# Patient Record
Sex: Female | Born: 1950 | Race: Black or African American | Hispanic: No | Marital: Single | Smoking: Never smoker
Health system: Southern US, Community
[De-identification: ages and names within clinical notes are randomized; demographics above are authoritative.]

## PROBLEM LIST (undated history)

## (undated) DIAGNOSIS — C50919 Malignant neoplasm of unspecified site of unspecified female breast: Secondary | ICD-10-CM

## (undated) DIAGNOSIS — Z9221 Personal history of antineoplastic chemotherapy: Secondary | ICD-10-CM

## (undated) DIAGNOSIS — Z923 Personal history of irradiation: Secondary | ICD-10-CM

## (undated) DIAGNOSIS — C801 Malignant (primary) neoplasm, unspecified: Secondary | ICD-10-CM

## (undated) DIAGNOSIS — E119 Type 2 diabetes mellitus without complications: Secondary | ICD-10-CM

## (undated) DIAGNOSIS — I10 Essential (primary) hypertension: Secondary | ICD-10-CM

---

## 2018-03-21 ENCOUNTER — Ambulatory Visit (INDEPENDENT_AMBULATORY_CARE_PROVIDER_SITE_OTHER): Payer: Medicare (Managed Care)

## 2018-03-21 ENCOUNTER — Ambulatory Visit (HOSPITAL_COMMUNITY)
Admission: EM | Admit: 2018-03-21 | Discharge: 2018-03-21 | Disposition: A | Discharge status: Home / Self Care | Payer: Medicare (Managed Care) | Attending: Family Medicine | Admitting: Family Medicine

## 2018-03-21 ENCOUNTER — Encounter (HOSPITAL_COMMUNITY): Payer: Self-pay | Admitting: Family Medicine

## 2018-03-21 DIAGNOSIS — M25562 Pain in left knee: Secondary | ICD-10-CM

## 2018-03-21 DIAGNOSIS — S8002XA Contusion of left knee, initial encounter: Secondary | ICD-10-CM | POA: Insufficient documentation

## 2018-03-21 HISTORY — DX: Essential (primary) hypertension: I10

## 2018-03-21 HISTORY — DX: Type 2 diabetes mellitus without complications: E11.9

## 2018-03-21 HISTORY — DX: Malignant (primary) neoplasm, unspecified: C80.1

## 2018-03-21 NOTE — Discharge Instructions (Addendum)
You may use over the counter ibuprofen or acetaminophen as needed.   Please follow up with your doctor of you are not seeing gradual improvement within one week.

## 2018-03-21 NOTE — ED Provider Notes (Signed)
[place]   [id number] 03/21/18 Arrival Time: 8338  ASSESSMENT & PLAN:  1. Acute pain of left knee   2. Contusion of left knee, initial encounter    I have personally viewed the imaging studies ordered this visit. No fractures or dislocation appreciated.  Prefers OTC analgesics as needed. WBAT. Ice to knee today.   Discharge Instructions     You may use over the counter ibuprofen or acetaminophen as needed.   Please follow up with your doctor of you are not seeing gradual improvement within one week.     Reviewed expectations re: course of current medical issues. Questions answered. Outlined signs and symptoms indicating need for more acute intervention. Patient verbalized understanding. After Visit Summary given.  SUBJECTIVE: History from: patient. Debra Bowman is a 67 y.o. female who reports fairly persistent mild to moderate pain of her left medial knee; described as aching without radiation. Onset: abrupt, this morning. Injury/trama: yes, reports walking down stairs; slipped "an my left knee bent back behind me as I fell"; immediate discomfort noted; able to bear weight immediately and since. Symptoms have progressed to a point and plateaued since beginning. Aggravating factors: movements of left knee. Alleviating factors: sitting and keeping knee still. Associated symptoms: none reported. Extremity sensation changes or weakness: none. Self treatment: has not tried OTCs for relief of pain. History of similar: no.  History reviewed. No pertinent surgical history.   ROS: As per HPI.   OBJECTIVE:  Vitals:   03/21/18 1007  BP: (!) 155/75  Pulse: 73  Resp: 16  Temp: 99.2 F (37.3 C)  TempSrc: Oral  SpO2: 100%    General appearance: alert; no distress Back: no midline tenderness over lower back; FROM at waist Extremities: . LLE: warm and well perfused; poorly localized mild to moderate tenderness over left medial knee; without gross  deformities; with no swelling; with no bruising; ROM: normal but with reported discomfort CV: brisk extremity capillary refill of LLE; 2+ SD and PT pulse of LLE. Skin: warm and dry; no visible rashes Neurologic: gait normal but favors LLE; normal reflexes of RLE and LLE; normal sensation of RLE and LLE; normal strength of RLE and LLE Psychological: alert and cooperative; normal mood and affect  Imaging: Dg Knee Complete 4 Views Left  Result Date: 03/21/2018 CLINICAL DATA:  67 year old female status post fall down stairs this morning with knee injury. Hyperflexion. Pain medial to the patella. EXAM: LEFT KNEE - COMPLETE 4+ VIEW COMPARISON:  None. FINDINGS: No evidence of fracture, dislocation, or joint effusion. Mild medial compartment joint space loss and degenerative spurring. The patella appears intact. No acute osseous abnormality identified. No discrete soft tissue injury. IMPRESSION: No acute osseous abnormality identified about the left knee. Electronically Signed   By: Genevie Ann M.D.   On: 03/21/2018 10:39   Allergies  Allergen Reactions  . Penicillins Hives    Past Medical History:  Diagnosis Date  . Cancer ([place])    breast  . Diabetes mellitus without complication ([place])   . Hypertension    Social History   Socioeconomic History  . Marital status: Single    Spouse name: Not on file  . Number of children: Not on file  . Years of education: Not on file  . Highest education level: Not on file  Occupational History  . Not on file  Social Needs  . Financial resource strain: Not on file  . Food insecurity:    Worry: Not on file  Inability: Not on file  . Transportation needs:    Medical: Not on file    Non-medical: Not on file  Tobacco Use  . Smoking status: Never Smoker  . Smokeless tobacco: Never Used  Substance and Sexual Activity  . Alcohol use: Never    Frequency: Never  . Drug use: Never  . Sexual activity: Not on file  Lifestyle  . Physical activity:    Days  per week: Not on file    Minutes per session: Not on file  . Stress: Not on file  Relationships  . Social connections:    Talks on phone: Not on file    Gets together: Not on file    Attends religious service: Not on file    Active member of club or organization: Not on file    Attends meetings of clubs or organizations: Not on file    Relationship status: Not on file  Other Topics Concern  . Not on file  Social History Narrative  . Not on file   History reviewed. No pertinent family history. History reviewed. No pertinent surgical history.    Vanessa Kick, MD 03/21/18 1125

## 2018-03-21 NOTE — ED Triage Notes (Signed)
Pt presents to [facility] for assessment of left knee pain after slipping down steps into a split, landing on the left knee this morning.

## 2018-12-31 ENCOUNTER — Encounter: Payer: Self-pay | Admitting: *Deleted

## 2018-12-31 NOTE — Progress Notes (Signed)
Received new pt referral fax from Dr. Jilda Panda.  Information faxed to Isaiah Blakes with new pt referrals [phone]).

## 2019-01-21 ENCOUNTER — Other Ambulatory Visit (HOSPITAL_COMMUNITY): Payer: Self-pay | Admitting: Optometry

## 2019-01-21 ENCOUNTER — Ambulatory Visit (HOSPITAL_COMMUNITY)
Admission: RE | Admit: 2019-01-21 | Discharge: 2019-01-21 | Disposition: A | Discharge status: Home / Self Care | Payer: Medicare Other | Source: Ambulatory Visit | Attending: Cardiovascular Disease | Admitting: Cardiovascular Disease

## 2019-01-21 ENCOUNTER — Other Ambulatory Visit: Payer: Self-pay

## 2019-01-21 DIAGNOSIS — G453 Amaurosis fugax: Secondary | ICD-10-CM | POA: Insufficient documentation

## 2019-01-30 ENCOUNTER — Telehealth: Payer: Self-pay | Admitting: Hematology and Oncology

## 2019-01-30 NOTE — Telephone Encounter (Signed)
Ms. Farahani has been cld and scheduled to see Dr. Lindi Adie on 11/23 at 3:45pm. Pt aware to arrive 15 minutes early.

## 2019-02-16 NOTE — Progress Notes (Signed)
[facility] CONSULT NOTE  Patient Care Team: System, Provider Not In as PCP - General  CHIEF COMPLAINTS/PURPOSE OF CONSULTATION:  History of bilateral breast cancers  HISTORY OF PRESENTING ILLNESS:  Debra Bowman 68 y.o. female is here because of a history of bilateral breast cancers for which she underwent treatment in [place]. The cancer was detected on a screening mammogram on 03/15/16. Diagnostic mammogram and [place] on 04/25/16 showed a 1.5cm left breast mass and calcifications in the inner right breast. 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 She underwent bilateral lumpectomies on 06/01/16 for which pathology showed in the right breast: invasive ductal carcinoma with DCIS, grade 2, 5 lymph nodes negative for carcinoma, and in the left breast: invasive mammary carcinoma, 1.5cm, 2 lymph nodes negative for carcinoma. Onctoype testing showed a score of 19 with a 12% chance of distant recurrence in 10 years with tamoxifen alone. She underwent adjuvant chemotherapy with 6 cycles of TCH and maintenance Herceptin. She underwent adjuvant radiation therapy and was started on adjuvant letrozole therapy with planned duration of 10 years. Bone density scan on 03/15/16 showed normal findings with a T-score of -0.6. Her most recent mammogram on 08/29/18 showed no evidence of malignancy bilaterally. She presents to the clinic today for initial evaluation.   I reviewed her records extensively and collaborated the history with the patient.  SUMMARY OF ONCOLOGIC HISTORY: Oncology History  Bilateral breast cancer ([place])  04/25/2017 Initial Diagnosis   Screen detected 1.5cm left breast mass and calcifications in the inner right breast. 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   06/01/2017 Surgery   right breast: invasive ductal carcinoma with DCIS, grade 2, 5 lymph nodes negative for carcinoma, and in the left breast: invasive mammary carcinoma, 1.5cm, 2 lymph nodes negative for carcinoma. Onctoype testing showed a score of 19 with a 12% chance of distant recurrence     Chemotherapy   TCH foll by Herceptin maintenance     Radiation Therapy   Bilateral Adj XRT   01/2018 -  Anti-estrogen oral therapy   Letrozole 2.5 mg daily     MEDICAL HISTORY:  Past Medical History:  Diagnosis Date  . Cancer ([place])    breast  . Diabetes mellitus without complication ([place])   . Hypertension     SURGICAL HISTORY: No past surgical history on file.  SOCIAL HISTORY: Social History   Socioeconomic History  . Marital status: Single    Spouse name: Not on file  . Number of children: Not on file  . Years of education: Not on file  . Highest education level: Not on file  Occupational History  . Not on file  Social Needs  . Financial resource strain: Not on file  . Food insecurity    Worry: Not on file    Inability: Not on file  . Transportation needs    Medical: Not on file    Non-medical: Not on file  Tobacco Use  . Smoking status: Never Smoker  . Smokeless tobacco: Never Used  Substance and Sexual Activity  . Alcohol use: Never    Frequency: Never  . Drug use: Never  . Sexual activity: Not on file  Lifestyle  .  Physical activity    Days per week: Not on file    Minutes per session: Not on file  . Stress: Not on file  Relationships  . Social Herbalist on phone: Not on file    Gets together: Not on file    Attends religious service: Not on file    Active member of club or organization: Not on file    Attends meetings of clubs or organizations: Not on file    Relationship status: Not on file  . Intimate partner violence    Fear of current or ex partner: Not on  file    Emotionally abused: Not on file    Physically abused: Not on file    Forced sexual activity: Not on file  Other Topics Concern  . Not on file  Social History Narrative  . Not on file    FAMILY HISTORY: No family history on file.  ALLERGIES:  is allergic to penicillins.  MEDICATIONS:  Current Outpatient Medications  Medication Sig Dispense Refill  . amLODipine (NORVASC) 10 MG tablet Take 10 mg by mouth daily.    Marland Kitchen aspirin EC 81 MG tablet Take 1 tablet (81 mg total) by mouth daily.    . Cholecalciferol (VITAMIN D3) 50 MCG (2000 UT) capsule Take 1 capsule (2,000 Units total) by mouth daily.    Marland Kitchen glipiZIDE (GLUCOTROL) 10 MG tablet Take 1 tablet (10 mg total) by mouth 2 (two) times daily before a meal.    . hydrochlorothiazide (HYDRODIURIL) 25 MG tablet Take 1 tablet (25 mg total) by mouth daily.    Marland Kitchen letrozole (FEMARA) 2.5 MG tablet Take 2.5 mg by mouth daily.    Marland Kitchen losartan (COZAAR) 100 MG tablet Take 1 tablet (100 mg total) by mouth daily.    Marland Kitchen losartan-hydrochlorothiazide (HYZAAR) 100-25 MG tablet Take 1 tablet by mouth daily.    . metoprolol succinate (TOPROL-XL) 100 MG 24 hr tablet Take 100 mg by mouth daily. Take with or immediately following a meal.    . Multiple Vitamins-Minerals (CENTRUM SILVER 50+WOMEN) TABS Take 1 tablet by mouth daily.    . vitamin B-12 (CYANOCOBALAMIN) 1000 MCG tablet Take 1 tablet (1,000 mcg total) by mouth daily.     No current facility-administered medications for this visit.     REVIEW OF SYSTEMS:   Constitutional: Denies fevers, chills or abnormal night sweats Eyes: Denies blurriness of vision, double vision or watery eyes Ears, nose, mouth, throat, and face: Denies mucositis or sore throat Respiratory: Denies cough, dyspnea or wheezes Cardiovascular: Denies palpitation, chest discomfort or lower extremity swelling Gastrointestinal:  Denies nausea, heartburn or change in bowel habits Skin: Denies abnormal skin rashes Lymphatics: Denies  new lymphadenopathy or easy bruising Neurological:Denies numbness, tingling or new weaknesses Behavioral/Psych: Mood is stable, no new changes  Breast: Denies any palpable lumps or discharge All other systems were reviewed with the patient and are negative.  PHYSICAL EXAMINATION: ECOG PERFORMANCE STATUS: 1 - Symptomatic but completely ambulatory  Vitals:   02/17/19 1540  BP: (!) 151/77  Pulse: 77  Resp: 18  Temp: 98.2 F (36.8 C)  SpO2: 98%   Filed Weights   02/17/19 1540  Weight: 252 lb 8 oz (114.5 kg)    GENERAL:alert, no distress and comfortable SKIN: skin color, texture, turgor are normal, no rashes or significant lesions EYES: normal, conjunctiva are pink and non-injected, sclera clear OROPHARYNX:no exudate, no erythema and lips, buccal mucosa, and tongue normal  NECK: supple, thyroid normal size, non-tender,  without nodularity LYMPH:  no palpable lymphadenopathy in the cervical, axillary or inguinal LUNGS: clear to auscultation and percussion with normal breathing effort HEART: regular rate & rhythm and no murmurs and no lower extremity edema ABDOMEN:abdomen soft, non-tender and normal bowel sounds Musculoskeletal:no cyanosis of digits and no clubbing  PSYCH: alert & oriented x 3 with fluent speech NEURO: no focal motor/sensory deficits BREAST: No palpable nodules in breast. No palpable axillary or supraclavicular lymphadenopathy (exam performed in the presence of a chaperone)   LABORATORY DATA:  I have reviewed the data as listed No results found for: WBC, HGB, HCT, MCV, PLT No results found for: NA, K, CL, CO2  RADIOGRAPHIC STUDIES: I have personally reviewed the radiological reports and agreed with the findings in the report.  ASSESSMENT AND PLAN:  Bilateral breast cancer ([place]) 06/01/2017:  Right breast: invasive ductal carcinoma with DCIS, grade 2, 5 lymph nodes negative for carcinoma,ER+ 91%, PR+ 71%, HER-2 negative   Left breast: invasive mammary carcinoma,  1.5cm, 2 lymph nodes negative for carcinoma ER+ 14%, PR+ 30%, HER-2 positive by FISH. Onctoype testing showed a score of 19 with a 12% chance of distant recurrence  (Care given in [facility])  S/P adj chemo with TCH-Herceptin maintenance Adj XRT  Current Treatment: Letrozole 2.5 mg Daily start 01/2018 Letrozole Toxicities: None  Surveillance: 1. Breast Exam: 02/17/2019: Benign 2. Mammograms: June 2020: Benign Her previous mammogram and ultrasound recommended ultrasound and mammogram in 1 year. Will order.  Patients wants to get refills 30 days at a time because of insurance reasons  RTC in 1 year     All questions were answered. The patient knows to call the clinic with any problems, questions or concerns.   Rulon Eisenmenger, MD, MPH 02/17/2019    I, Molly Dorshimer, am acting as scribe for Nicholas Lose, MD.  I have reviewed the above documentation for accuracy and completeness, and I agree with the above.

## 2019-02-17 ENCOUNTER — Other Ambulatory Visit: Payer: Self-pay

## 2019-02-17 ENCOUNTER — Inpatient Hospital Stay: Payer: Medicare Other | Attending: Hematology and Oncology | Admitting: Hematology and Oncology

## 2019-02-17 VITALS — BP 151/77 | HR 77 | Temp 98.2°F | Resp 18 | Wt 252.5 lb

## 2019-02-17 DIAGNOSIS — C50912 Malignant neoplasm of unspecified site of left female breast: Secondary | ICD-10-CM | POA: Diagnosis present

## 2019-02-17 DIAGNOSIS — I10 Essential (primary) hypertension: Secondary | ICD-10-CM

## 2019-02-17 DIAGNOSIS — Z79811 Long term (current) use of aromatase inhibitors: Secondary | ICD-10-CM | POA: Diagnosis not present

## 2019-02-17 DIAGNOSIS — E119 Type 2 diabetes mellitus without complications: Secondary | ICD-10-CM

## 2019-02-17 DIAGNOSIS — Z79899 Other long term (current) drug therapy: Secondary | ICD-10-CM | POA: Diagnosis not present

## 2019-02-17 DIAGNOSIS — Z7982 Long term (current) use of aspirin: Secondary | ICD-10-CM

## 2019-02-17 DIAGNOSIS — Z170 Estrogen receptor positive status [ER+]: Secondary | ICD-10-CM

## 2019-02-17 DIAGNOSIS — Z7984 Long term (current) use of oral hypoglycemic drugs: Secondary | ICD-10-CM

## 2019-02-17 DIAGNOSIS — C50911 Malignant neoplasm of unspecified site of right female breast: Secondary | ICD-10-CM

## 2019-02-17 MED ORDER — ASPIRIN EC 81 MG PO TBEC: 81.0000 mg | DELAYED_RELEASE_TABLET | Freq: Every day | ORAL | Status: AC

## 2019-02-17 MED ORDER — VITAMIN B-12 1000 MCG PO TABS: 1000.0000 ug | ORAL_TABLET | Freq: Every day | ORAL | Status: AC

## 2019-02-17 MED ORDER — LOSARTAN POTASSIUM 100 MG PO TABS: 100.0000 mg | ORAL_TABLET | Freq: Every day | ORAL | Status: AC

## 2019-02-17 MED ORDER — HYDROCHLOROTHIAZIDE 25 MG PO TABS: 25.0000 mg | ORAL_TABLET | Freq: Every day | ORAL | Status: AC

## 2019-02-17 MED ORDER — CENTRUM SILVER 50+WOMEN PO TABS: 1.0000 | ORAL_TABLET | Freq: Every day | ORAL | Status: AC

## 2019-02-17 MED ORDER — VITAMIN D3 50 MCG (2000 UT) PO CAPS: 2000.0000 [IU] | ORAL_CAPSULE | Freq: Every day | ORAL | Status: AC

## 2019-02-17 MED ORDER — GLIPIZIDE 10 MG PO TABS: 10.0000 mg | ORAL_TABLET | Freq: Two times a day (BID) | ORAL | Status: AC

## 2019-02-17 NOTE — Assessment & Plan Note (Signed)
06/01/2017:  Right breast: invasive ductal carcinoma with DCIS, grade 2, 5 lymph nodes negative for carcinoma,ER+ 91%, PR+ 71%, HER-2 negative   Left breast: invasive mammary carcinoma, 1.5cm, 2 lymph nodes negative for carcinoma ER+ 14%, PR+ 30%, HER-2 positive by FISH. Onctoype testing showed a score of 19 with a 12% chance of distant recurrence  (Care given in [facility] in [facility])  S/P adj chemo with TCH-Herceptin maintenance Adj XRT  Current Treatment: Letrozole 2.5 mg Daily start 01/2018 Letrozole Toxicities: None  Surveillance: 1. Breast Exam: 02/17/2019: Benign 2. Mammograms: June 2020: Benign Her previous mammogram and ultrasound recommended ultrasound and mammogram in 1 year. Will order.  Patients wants to get refills 30 days at a time because of insurance reasons  RTC in 1 year

## 2019-02-18 ENCOUNTER — Telehealth: Payer: Self-pay | Admitting: Hematology and Oncology

## 2019-02-18 NOTE — Telephone Encounter (Signed)
I talk with patient regarding schedule  

## 2019-04-01 ENCOUNTER — Other Ambulatory Visit: Payer: Self-pay

## 2019-04-01 MED ORDER — LETROZOLE 2.5 MG PO TABS: 2.5000 mg | ORAL_TABLET | Freq: Every day | ORAL | 11 refills | Status: DC

## 2019-05-26 ENCOUNTER — Ambulatory Visit: Payer: Medicare Other | Attending: Internal Medicine

## 2019-05-26 DIAGNOSIS — Z23 Encounter for immunization: Secondary | ICD-10-CM | POA: Insufficient documentation

## 2019-05-26 NOTE — Progress Notes (Signed)
   Covid-19 Vaccination Clinic  Name:  Demeisha Bottari    MRN: [id number] DOB: 1950/11/23  05/26/2019  Ms. Coudriet was observed post Covid-19 immunization for 15 minutes without incidence. She was provided with Vaccine Information Sheet and instruction to access the V-Safe system.   Ms. Brienza was instructed to call 911 with any severe reactions post vaccine: Marland Kitchen Difficulty breathing  . Swelling of your face and throat  . A fast heartbeat  . A bad rash all over your body  . Dizziness and weakness    Immunizations Administered    Name Date Dose VIS Date Route   Pfizer COVID-19 Vaccine 05/26/2019 11:51 AM 0.3 mL 03/07/2019 Intramuscular   Manufacturer: [place]   Lot: [id number]   [place]: [id number]

## 2019-06-24 ENCOUNTER — Ambulatory Visit: Payer: Medicare Other | Attending: Internal Medicine

## 2019-06-24 DIAGNOSIS — Z23 Encounter for immunization: Secondary | ICD-10-CM

## 2019-06-24 NOTE — Progress Notes (Signed)
   Covid-19 Vaccination Clinic  Name:  Debra Bowman    MRN: [id number] DOB: 01-12-1951  06/24/2019  [name] was observed post Covid-19 immunization for 15 minutes without incident. She was provided with Vaccine Information Sheet and instruction to access the V-Safe system.   [name] was instructed to call 911 with any severe reactions post vaccine: Marland Kitchen Difficulty breathing  . Swelling of face and throat  . A fast heartbeat  . A bad rash all over body  . Dizziness and weakness   Immunizations Administered    Name Date Dose VIS Date Route   Pfizer COVID-19 Vaccine 06/24/2019 12:01 PM 0.3 mL 03/07/2019 Intramuscular   Manufacturer: [employer]   Lot: [id number]   [place]: [id number]

## 2019-10-08 ENCOUNTER — Other Ambulatory Visit: Payer: Medicare Other

## 2019-11-19 ENCOUNTER — Ambulatory Visit
Admission: RE | Admit: 2019-11-19 | Discharge: 2019-11-19 | Disposition: A | Discharge status: Home / Self Care | Payer: Medicare Other | Source: Ambulatory Visit | Attending: Hematology and Oncology | Admitting: Hematology and Oncology

## 2019-11-19 ENCOUNTER — Ambulatory Visit: Payer: Medicare Other

## 2019-11-19 ENCOUNTER — Ambulatory Visit: Admission: RE | Admit: 2019-11-19 | Payer: Medicare Other | Source: Ambulatory Visit

## 2019-11-19 ENCOUNTER — Other Ambulatory Visit: Payer: Self-pay

## 2019-11-19 DIAGNOSIS — C50911 Malignant neoplasm of unspecified site of right female breast: Secondary | ICD-10-CM

## 2019-11-19 DIAGNOSIS — Z170 Estrogen receptor positive status [ER+]: Secondary | ICD-10-CM

## 2020-02-16 NOTE — Progress Notes (Signed)
Patient Care Team: System, Provider Not In as PCP - General  DIAGNOSIS:    ICD-10-CM   1. Bilateral malignant neoplasm of breast in female, estrogen receptor positive, unspecified site of breast ([place])  C50.911    Z17.0    C50.912     SUMMARY OF ONCOLOGIC HISTORY: Oncology History  Bilateral breast cancer ([place])  04/25/2017 Initial Diagnosis   [place]: Screen detected 1.5cm left breast mass and calcifications in the inner right breast. 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   06/01/2017 Surgery   right breast: invasive ductal carcinoma with DCIS, grade 2, 5 lymph nodes negative for carcinoma, and in the left breast: invasive mammary carcinoma, 1.5cm, 2 lymph nodes negative for carcinoma. Onctoype testing showed a score of 19 with a 12% chance of distant recurrence     Chemotherapy   TCH foll by Herceptin maintenance     Radiation Therapy   Bilateral Adj XRT   01/2018 -  Anti-estrogen oral therapy   Letrozole 2.5 mg daily     CHIEF COMPLIANT: Follow-up of bilateral breast cancers on letrozole   INTERVAL HISTORY: Debra Bowman is a 69 y.o. with above-mentioned history of bilateral breast cancers currently on antiestrogen therapy with letrozole. Mammogram on 11/19/19 showed no evidence of malignancy bilaterally. She presents to the clinic today for follow-up.  She is tolerating letrozole extremely well without any problems or concerns.  Denies any hot flashes or arthralgias myalgias.  Denies any lumps or nodules in the breast.  She has not been exercising much.  ALLERGIES:  is allergic to penicillins.  MEDICATIONS:  Current Outpatient Medications  Medication Sig Dispense Refill  . amLODipine (NORVASC) 10 MG tablet Take 10 mg by mouth daily.    Marland Kitchen aspirin EC 81 MG tablet Take 1 tablet (81 mg total) by mouth daily.      . Cholecalciferol (VITAMIN D3) 50 MCG (2000 UT) capsule Take 1 capsule (2,000 Units total) by mouth daily.    Marland Kitchen glipiZIDE (GLUCOTROL) 10 MG tablet Take 1 tablet (10 mg total) by mouth 2 (two) times daily before a meal.    . hydrochlorothiazide (HYDRODIURIL) 25 MG tablet Take 1 tablet (25 mg total) by mouth daily.    Marland Kitchen letrozole (FEMARA) 2.5 MG tablet Take 1 tablet (2.5 mg total) by mouth daily. 30 tablet 11  . losartan (COZAAR) 100 MG tablet Take 1 tablet (100 mg total) by mouth daily.    . metoprolol succinate (TOPROL-XL) 100 MG 24 hr tablet Take 100 mg by mouth daily. Take with or immediately following a meal.    . Multiple Vitamins-Minerals (CENTRUM SILVER 50+WOMEN) TABS Take 1 tablet by mouth daily.    . vitamin B-12 (CYANOCOBALAMIN) 1000 MCG tablet Take 1 tablet (1,000 mcg total) by mouth daily.     No current facility-administered medications for this visit.    PHYSICAL EXAMINATION: ECOG PERFORMANCE STATUS: 1 - Symptomatic but completely ambulatory  Vitals:   02/17/20 0941  BP: 138/67  Pulse: 88  Resp: 17  Temp: 98 F (36.7 C)  SpO2: 98%   Filed Weights   02/17/20 0941  Weight: 248 lb 12.8 oz (112.9 kg)    BREAST: No palpable masses or nodules in either right or left breasts. No palpable axillary supraclavicular or infraclavicular adenopathy no breast tenderness or nipple discharge. (exam performed in the  presence of a chaperone)  LABORATORY DATA:  I have reviewed the data as listed No flowsheet data found.  No results found for: WBC, HGB, HCT, MCV, PLT, NEUTROABS  ASSESSMENT & PLAN:  Bilateral breast cancer ([place]) 06/01/2017:  Right breast: invasive ductal carcinoma with DCIS, grade 2, 5 lymph nodes negative for carcinoma,ER+ 91%, PR+ 71%, HER-2 negative   Left breast: invasive mammary carcinoma, 1.5cm, 2 lymph nodes negative for carcinoma ER+ 14%, PR+ 30%, HER-2 positive by FISH. Onctoype testing showed a score of 19 with a 12% chance of distant recurrence  (Care  given in [facility] in [facility])  S/P adj chemo with TCH-Herceptin maintenance Adj XRT  Current Treatment: Letrozole 2.5 mg Daily start 01/2018 Letrozole Toxicities: None  Surveillance: 1. Breast Exam: 02/17/2020: Benign 2. Mammograms: 11/19/2019 Benign  Patients wants to get refills 30 days at a time because of insurance reasons  RTC in 1 year    No orders of the defined types were placed in this encounter.  The patient has a good understanding of the overall plan. she agrees with it. she will call with any problems that may develop before the next visit here.  Total time spent: 20 mins including face to face time and time spent for planning, charting and coordination of care  Debra Lose, MD 02/17/2020  I, Cloyde Reams Dorshimer, am acting as scribe for Dr. Nicholas Bowman.  I have reviewed the above documentation for accuracy and completeness, and I agree with the above.

## 2020-02-16 NOTE — Assessment & Plan Note (Signed)
06/01/2017:  Right breast: invasive ductal carcinoma with DCIS, grade 2, 5 lymph nodes negative for carcinoma,ER+ 91%, PR+ 71%, HER-2 negative   Left breast: invasive mammary carcinoma, 1.5cm, 2 lymph nodes negative for carcinoma ER+ 14%, PR+ 30%, HER-2 positive by FISH. Onctoype testing showed a score of 19 with a 12% chance of distant recurrence  (Care given in [facility] in [facility])  S/P adj chemo with TCH-Herceptin maintenance Adj XRT  Current Treatment: Letrozole 2.5 mg Daily start 01/2018 Letrozole Toxicities: None  Surveillance: 1. Breast Exam: 02/17/2020: Benign 2. Mammograms: 11/19/2019 Benign   Patients wants to get refills 30 days at a time because of insurance reasons  RTC in 1 year

## 2020-02-17 ENCOUNTER — Inpatient Hospital Stay: Payer: Medicare Other | Attending: Hematology and Oncology | Admitting: Hematology and Oncology

## 2020-02-17 ENCOUNTER — Other Ambulatory Visit: Payer: Self-pay

## 2020-02-17 DIAGNOSIS — C50912 Malignant neoplasm of unspecified site of left female breast: Secondary | ICD-10-CM | POA: Diagnosis present

## 2020-02-17 DIAGNOSIS — Z79811 Long term (current) use of aromatase inhibitors: Secondary | ICD-10-CM | POA: Diagnosis not present

## 2020-02-17 DIAGNOSIS — Z923 Personal history of irradiation: Secondary | ICD-10-CM | POA: Insufficient documentation

## 2020-02-17 DIAGNOSIS — Z9221 Personal history of antineoplastic chemotherapy: Secondary | ICD-10-CM | POA: Diagnosis not present

## 2020-02-17 DIAGNOSIS — C50911 Malignant neoplasm of unspecified site of right female breast: Secondary | ICD-10-CM | POA: Diagnosis present

## 2020-02-17 DIAGNOSIS — Z170 Estrogen receptor positive status [ER+]: Secondary | ICD-10-CM | POA: Diagnosis not present

## 2020-03-29 ENCOUNTER — Other Ambulatory Visit: Payer: Self-pay | Admitting: Hematology and Oncology

## 2020-05-25 ENCOUNTER — Other Ambulatory Visit: Payer: Self-pay | Admitting: Hematology and Oncology

## 2020-12-26 ENCOUNTER — Other Ambulatory Visit: Payer: Self-pay | Admitting: Hematology and Oncology

## 2020-12-27 ENCOUNTER — Other Ambulatory Visit: Payer: Self-pay | Admitting: Hematology and Oncology

## 2021-01-06 ENCOUNTER — Other Ambulatory Visit: Payer: Self-pay | Admitting: Hematology and Oncology

## 2021-01-06 DIAGNOSIS — Z853 Personal history of malignant neoplasm of breast: Secondary | ICD-10-CM

## 2021-01-06 DIAGNOSIS — Z9889 Other specified postprocedural states: Secondary | ICD-10-CM

## 2021-01-25 ENCOUNTER — Other Ambulatory Visit: Payer: Self-pay | Admitting: Hematology and Oncology

## 2021-02-09 ENCOUNTER — Other Ambulatory Visit: Payer: Self-pay

## 2021-02-09 ENCOUNTER — Ambulatory Visit
Admission: RE | Admit: 2021-02-09 | Discharge: 2021-02-09 | Disposition: A | Discharge status: Home / Self Care | Payer: Medicare Other | Source: Ambulatory Visit | Attending: Hematology and Oncology | Admitting: Hematology and Oncology

## 2021-02-09 ENCOUNTER — Other Ambulatory Visit: Payer: Self-pay | Admitting: Hematology and Oncology

## 2021-02-09 DIAGNOSIS — Z853 Personal history of malignant neoplasm of breast: Secondary | ICD-10-CM

## 2021-02-09 DIAGNOSIS — Z9889 Other specified postprocedural states: Secondary | ICD-10-CM

## 2021-02-09 HISTORY — DX: Malignant neoplasm of unspecified site of unspecified female breast: C50.919

## 2021-02-09 HISTORY — DX: Personal history of irradiation: Z92.3

## 2021-02-09 HISTORY — DX: Personal history of antineoplastic chemotherapy: Z92.21

## 2021-02-15 NOTE — Progress Notes (Signed)
Patient Care Team: Jilda Panda, MD as PCP - General (Internal Medicine)  DIAGNOSIS:    ICD-10-CM   1. Bilateral malignant neoplasm of breast in female, estrogen receptor positive, unspecified site of breast ([place])  C50.911    Z17.0    C50.912       SUMMARY OF ONCOLOGIC HISTORY: Oncology History  Bilateral breast cancer ([place])  04/25/2017 Initial Diagnosis   [place]: Screen detected 1.5cm left breast mass and calcifications in the inner right breast. 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   06/01/2017 Surgery   right breast: invasive ductal carcinoma with DCIS, grade 2, 5 lymph nodes negative for carcinoma, and in the left breast: invasive mammary carcinoma, 1.5cm, 2 lymph nodes negative for carcinoma. Onctoype testing showed a score of 19 with a 12% chance of distant recurrence     Chemotherapy   TCH foll by Herceptin maintenance     Radiation Therapy   Bilateral Adj XRT   01/2018 -  Anti-estrogen oral therapy   Letrozole 2.5 mg daily     CHIEF COMPLIANT: Follow-up of bilateral breast cancers on letrozole   INTERVAL HISTORY: Debra Bowman is a 70 y.o. with above-mentioned history of bilateral breast cancers currently on antiestrogen therapy with letrozole. Mammogram on 02/09/2021 showed no evidence of malignancy bilaterally. She presents to the clinic today for follow-up.   ALLERGIES:  is allergic to penicillins.  MEDICATIONS:  Current Outpatient Medications  Medication Sig Dispense Refill   amLODipine (NORVASC) 10 MG tablet Take 10 mg by mouth daily.     aspirin EC 81 MG tablet Take 1 tablet (81 mg total) by mouth daily.     Cholecalciferol (VITAMIN D3) 50 MCG (2000 UT) capsule Take 1 capsule (2,000 Units total) by mouth daily.     glipiZIDE (GLUCOTROL) 10 MG tablet Take 1 tablet (10 mg total) by mouth 2  (two) times daily before a meal.     hydrochlorothiazide (HYDRODIURIL) 25 MG tablet Take 1 tablet (25 mg total) by mouth daily.     letrozole (FEMARA) 2.5 MG tablet TAKE 1 TABLET(2.5 MG) BY MOUTH DAILY 90 tablet 0   losartan (COZAAR) 100 MG tablet Take 1 tablet (100 mg total) by mouth daily.     metoprolol succinate (TOPROL-XL) 100 MG 24 hr tablet Take 100 mg by mouth daily. Take with or immediately following a meal.     Multiple Vitamins-Minerals (CENTRUM SILVER 50+WOMEN) TABS Take 1 tablet by mouth daily.     vitamin B-12 (CYANOCOBALAMIN) 1000 MCG tablet Take 1 tablet (1,000 mcg total) by mouth daily.     No current facility-administered medications for this visit.    PHYSICAL EXAMINATION: ECOG PERFORMANCE STATUS: 1 - Symptomatic but completely ambulatory  There were no vitals filed for this visit. There were no vitals filed for this visit.  BREAST: No palpable masses or nodules in either right or left breasts. No palpable axillary supraclavicular or infraclavicular adenopathy no breast tenderness or nipple discharge. (exam performed in the presence of a chaperone)  LABORATORY DATA:  I have reviewed the data as listed No flowsheet data found.  No results found for: WBC, HGB, HCT, MCV, PLT, NEUTROABS  ASSESSMENT & PLAN:  Bilateral breast cancer ([place]) 06/01/2017:  Right breast: invasive ductal carcinoma with DCIS, grade 2, 5 lymph nodes negative for carcinoma,ER+ 91%, PR+ 71%,  HER-2 negative    Left breast: invasive mammary carcinoma, 1.5cm, 2 lymph nodes negative for carcinoma ER+ 14%, PR+ 30%, HER-2 positive by FISH. Onctoype testing showed a score of 19 with a 12% chance of distant recurrence  (Care given in [facility] in [facility])   S/P adj chemo with TCH-Herceptin maintenance Adj XRT   Current Treatment: Letrozole 2.5 mg Daily started 01/2018 Letrozole Toxicities: None   Surveillance: 1. Breast Exam: 02/16/2021: Benign 2. Mammograms:  02/10/2021 Benign breast density category  B   Patients wants to get refills 30 days at a time because of insurance reasons   RTC in 1 year      No orders of the defined types were placed in this encounter.  The patient has a good understanding of the overall plan. she agrees with it. she will call with any problems that may develop before the next visit here.  Total time spent: 20 mins including face to face time and time spent for planning, charting and coordination of care  Rulon Eisenmenger, MD, MPH 02/16/2021  I, Thana Ates, am acting as scribe for Dr. Nicholas Lose.  I have reviewed the above documentation for accuracy and completeness, and I agree with the above.

## 2021-02-16 ENCOUNTER — Other Ambulatory Visit: Payer: Self-pay

## 2021-02-16 ENCOUNTER — Inpatient Hospital Stay: Payer: Medicare Other | Attending: Hematology and Oncology | Admitting: Hematology and Oncology

## 2021-02-16 DIAGNOSIS — Z79811 Long term (current) use of aromatase inhibitors: Secondary | ICD-10-CM | POA: Insufficient documentation

## 2021-02-16 DIAGNOSIS — C50912 Malignant neoplasm of unspecified site of left female breast: Secondary | ICD-10-CM | POA: Diagnosis not present

## 2021-02-16 DIAGNOSIS — Z7982 Long term (current) use of aspirin: Secondary | ICD-10-CM | POA: Diagnosis not present

## 2021-02-16 DIAGNOSIS — Z79899 Other long term (current) drug therapy: Secondary | ICD-10-CM | POA: Insufficient documentation

## 2021-02-16 DIAGNOSIS — Z7984 Long term (current) use of oral hypoglycemic drugs: Secondary | ICD-10-CM | POA: Insufficient documentation

## 2021-02-16 DIAGNOSIS — Z923 Personal history of irradiation: Secondary | ICD-10-CM | POA: Insufficient documentation

## 2021-02-16 DIAGNOSIS — C50911 Malignant neoplasm of unspecified site of right female breast: Secondary | ICD-10-CM | POA: Diagnosis not present

## 2021-02-16 DIAGNOSIS — Z170 Estrogen receptor positive status [ER+]: Secondary | ICD-10-CM | POA: Insufficient documentation

## 2021-02-16 DIAGNOSIS — Z9221 Personal history of antineoplastic chemotherapy: Secondary | ICD-10-CM | POA: Diagnosis not present

## 2021-02-16 MED ORDER — LETROZOLE 2.5 MG PO TABS: 2.5000 mg | ORAL_TABLET | Freq: Every day | ORAL | 12 refills | Status: DC

## 2021-02-16 NOTE — Assessment & Plan Note (Signed)
06/01/2017:  Right breast: invasive ductal carcinoma with DCIS, grade 2, 5 lymph nodes negative for carcinoma,ER+ 91%, PR+ 71%, HER-2 negative  Left breast:invasive mammary carcinoma, 1.5cm, 2 lymph nodes negative for carcinoma ER+ 14%, PR+ 30%, HER-2 positive by FISH. Onctoype testing showed a score of 19 with a 12% chance of distant recurrence  (Care given in [facility] in [facility])  S/P adj chemo with TCH-Herceptin maintenance Adj XRT  Current Treatment: Letrozole 2.5 mg Daily start 01/2018 Letrozole Toxicities: None  Surveillance: 1. Breast Exam: 02/16/2021: Benign 2. Mammograms:  02/10/2021 Benign breast density category B  Patients wants to get refills 30 days at a time because of insurance reasons  RTC in 1 year

## 2021-03-06 LAB — COLOGUARD: COLOGUARD: NEGATIVE

## 2021-03-06 LAB — EXTERNAL GENERIC LAB PROCEDURE: COLOGUARD: NEGATIVE

## 2021-12-16 ENCOUNTER — Other Ambulatory Visit: Payer: Self-pay | Admitting: Hematology and Oncology

## 2021-12-16 DIAGNOSIS — Z1231 Encounter for screening mammogram for malignant neoplasm of breast: Secondary | ICD-10-CM

## 2022-01-25 ENCOUNTER — Other Ambulatory Visit: Payer: Self-pay | Admitting: Hematology and Oncology

## 2022-02-02 ENCOUNTER — Ambulatory Visit: Payer: Medicare Other | Admitting: Hematology and Oncology

## 2022-02-05 NOTE — Progress Notes (Signed)
Patient Care Team: Jilda Panda, MD as PCP - General (Internal Medicine)  DIAGNOSIS: No diagnosis found.  SUMMARY OF ONCOLOGIC HISTORY: Oncology History  Bilateral breast cancer ([place])  04/05/2016 Initial Diagnosis   [place]: Screen detected 1.5cm left breast mass and calcifications in the inner right breast. 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   06/01/2016 Surgery   Bilateral lumpectomies: right breast: invasive ductal carcinoma with DCIS, grade 2, 5 lymph nodes negative for carcinoma, and in the left breast: invasive mammary carcinoma, 1.5cm, 2 lymph nodes negative for carcinoma. Onctoype testing showed a score of 19 with a 12% chance of distant recurrence    06/2016 - 05/2017 Chemotherapy   TCH foll by Herceptin maintenance    12/2016 - 02/15/2017 Radiation Therapy   Bilateral Adj XRT   01/2017 -  Anti-estrogen oral therapy   Letrozole 2.5 mg daily     CHIEF COMPLIANT:   INTERVAL HISTORY: RACHELANNE WHIDBY is a   ALLERGIES:  is allergic to penicillins.  MEDICATIONS:  Current Outpatient Medications  Medication Sig Dispense Refill   amLODipine (NORVASC) 10 MG tablet Take 10 mg by mouth daily.     aspirin EC 81 MG tablet Take 1 tablet (81 mg total) by mouth daily.     Cholecalciferol (VITAMIN D3) 50 MCG (2000 UT) capsule Take 1 capsule (2,000 Units total) by mouth daily.     glipiZIDE (GLUCOTROL) 10 MG tablet Take 1 tablet (10 mg total) by mouth 2 (two) times daily before a meal.     hydrochlorothiazide (HYDRODIURIL) 25 MG tablet Take 1 tablet (25 mg total) by mouth daily.     letrozole (FEMARA) 2.5 MG tablet TAKE 1 TABLET(2.5 MG) BY MOUTH DAILY 90 tablet 0   losartan (COZAAR) 100 MG tablet Take 1 tablet (100 mg total) by mouth daily.     metoprolol succinate (TOPROL-XL) 100 MG 24 hr tablet Take 100 mg by mouth  daily. Take with or immediately following a meal.     Multiple Vitamins-Minerals (CENTRUM SILVER 50+WOMEN) TABS Take 1 tablet by mouth daily.     vitamin B-12 (CYANOCOBALAMIN) 1000 MCG tablet Take 1 tablet (1,000 mcg total) by mouth daily.     No current facility-administered medications for this visit.    PHYSICAL EXAMINATION: ECOG PERFORMANCE STATUS: {CHL ONC ECOG [phone]}  There were no vitals filed for this visit. There were no vitals filed for this visit.  BREAST:*** No palpable masses or nodules in either right or left breasts. No palpable axillary supraclavicular or infraclavicular adenopathy no breast tenderness or nipple discharge. (exam performed in the presence of a chaperone)  LABORATORY DATA:  I have reviewed the data as listed     No data to display          No results found for: "WBC", "HGB", "HCT", "MCV", "PLT", "NEUTROABS"  ASSESSMENT & PLAN:  No problem-specific Assessment & Plan notes found for this encounter.    No orders of the defined types were placed in this encounter.  The patient has a good understanding of the overall plan. she agrees with it. she will call with any problems that may develop before the next visit here. Total time spent: 30 mins including face to face time and time spent for planning, charting and co-ordination of care   Suzzette Righter, [facility] 02/05/22  I Saunders [place], Shomari Scicchitano am scribing for Dr. Gudena  ***  

## 2022-02-07 ENCOUNTER — Inpatient Hospital Stay: Payer: Medicare Other | Attending: Hematology and Oncology | Admitting: Hematology and Oncology

## 2022-02-07 VITALS — BP 154/71 | HR 68 | Temp 97.7°F | Resp 18 | Wt 201.4 lb

## 2022-02-07 DIAGNOSIS — R200 Anesthesia of skin: Secondary | ICD-10-CM | POA: Insufficient documentation

## 2022-02-07 DIAGNOSIS — Z9221 Personal history of antineoplastic chemotherapy: Secondary | ICD-10-CM | POA: Diagnosis not present

## 2022-02-07 DIAGNOSIS — Z79899 Other long term (current) drug therapy: Secondary | ICD-10-CM | POA: Diagnosis not present

## 2022-02-07 DIAGNOSIS — Z79811 Long term (current) use of aromatase inhibitors: Secondary | ICD-10-CM | POA: Insufficient documentation

## 2022-02-07 DIAGNOSIS — C50912 Malignant neoplasm of unspecified site of left female breast: Secondary | ICD-10-CM | POA: Diagnosis present

## 2022-02-07 DIAGNOSIS — C50911 Malignant neoplasm of unspecified site of right female breast: Secondary | ICD-10-CM

## 2022-02-07 DIAGNOSIS — Z7984 Long term (current) use of oral hypoglycemic drugs: Secondary | ICD-10-CM | POA: Diagnosis not present

## 2022-02-07 DIAGNOSIS — Z923 Personal history of irradiation: Secondary | ICD-10-CM | POA: Insufficient documentation

## 2022-02-07 DIAGNOSIS — Z170 Estrogen receptor positive status [ER+]: Secondary | ICD-10-CM

## 2022-02-07 DIAGNOSIS — Z7982 Long term (current) use of aspirin: Secondary | ICD-10-CM | POA: Diagnosis not present

## 2022-02-07 MED ORDER — LETROZOLE 2.5 MG PO TABS: 2.5000 mg | ORAL_TABLET | Freq: Every day | ORAL | 11 refills | Status: DC

## 2022-02-07 NOTE — Assessment & Plan Note (Addendum)
06/01/2016:  Right breast: invasive ductal carcinoma with DCIS, grade 2, 5 lymph nodes negative for carcinoma,ER+ 91%, PR+ 71%, HER-2 negative    Left breast: invasive mammary carcinoma, 1.5cm, 2 lymph nodes negative for carcinoma ER+ 14%, PR+ 30%, HER-2 positive by FISH. Onctoype testing showed a score of 19 with a 12% chance of distant recurrence  (Care given in [facility] in [facility])   S/P adj chemo with TCH-Herceptin maintenance Adj XRT   Current Treatment: Letrozole 2.5 mg Daily started 01/2017 Letrozole Toxicities: None We will plan to treat her for 7 years   Surveillance: 1. Breast Exam: 02/07/2022: Benign 2. Mammograms: Scheduled for 02/10/2022   Patients wants to get refills 30 days at a time because of insurance reasons   RTC in 1 year

## 2022-02-10 ENCOUNTER — Ambulatory Visit
Admission: RE | Admit: 2022-02-10 | Discharge: 2022-02-10 | Disposition: A | Discharge status: Home / Self Care | Payer: Medicare Other | Source: Ambulatory Visit | Attending: Hematology and Oncology | Admitting: Hematology and Oncology

## 2022-02-10 DIAGNOSIS — Z1231 Encounter for screening mammogram for malignant neoplasm of breast: Secondary | ICD-10-CM

## 2022-02-21 ENCOUNTER — Ambulatory Visit: Payer: Medicare Other | Admitting: Hematology and Oncology

## 2022-03-28 ENCOUNTER — Other Ambulatory Visit: Payer: Self-pay | Admitting: Hematology and Oncology

## 2022-12-26 IMAGING — MG MM DIGITAL SCREENING BILAT W/ TOMO AND CAD
8 series · 8 of 24 positions shown · non-contrast
Comparison: Previous exam(s).

CLINICAL DATA: Screening. Bilateral lumpectomies.

EXAM:
DIGITAL SCREENING BILATERAL MAMMOGRAM WITH TOMOSYNTHESIS AND CAD
TECHNIQUE: Bilateral screening digital craniocaudal and mediolateral oblique
mammograms were obtained. Bilateral screening digital breast
tomosynthesis was performed. The images were evaluated with
computer-aided detection.

[R MLO synth-2D]
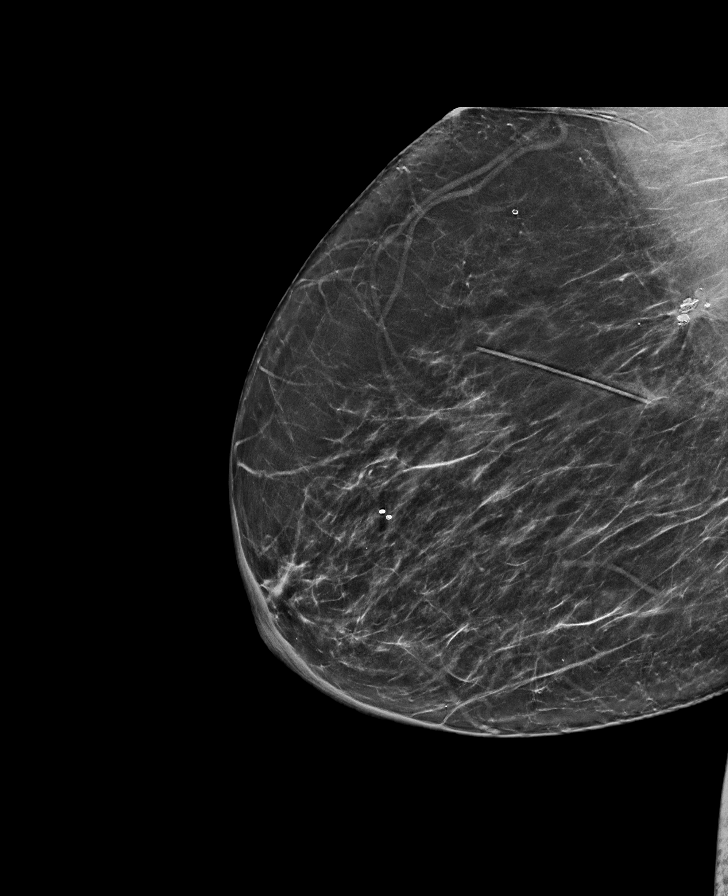

[L MLO synth-2D]
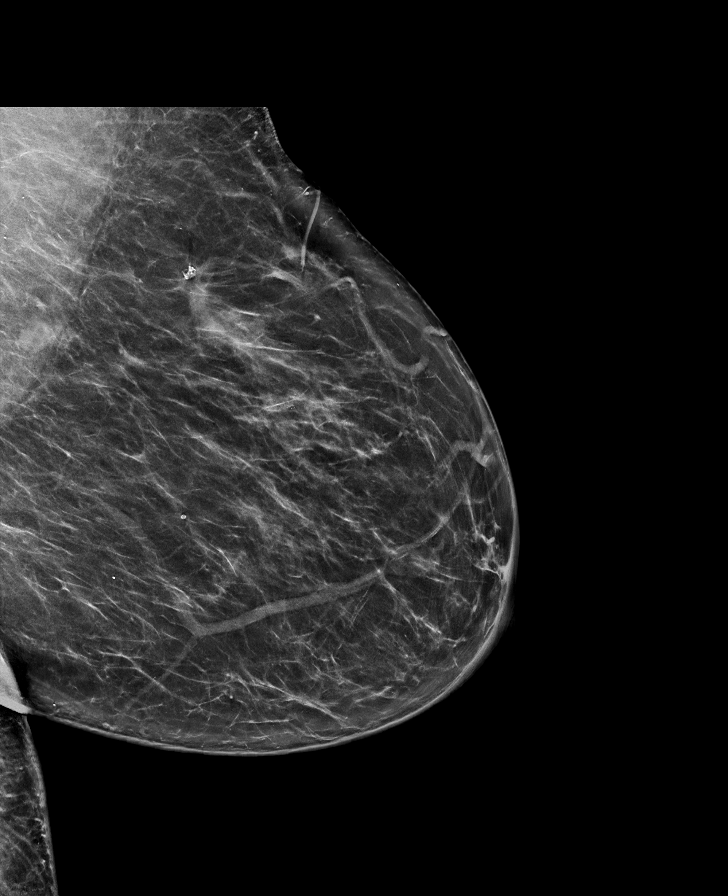

[R CC synth-2D]
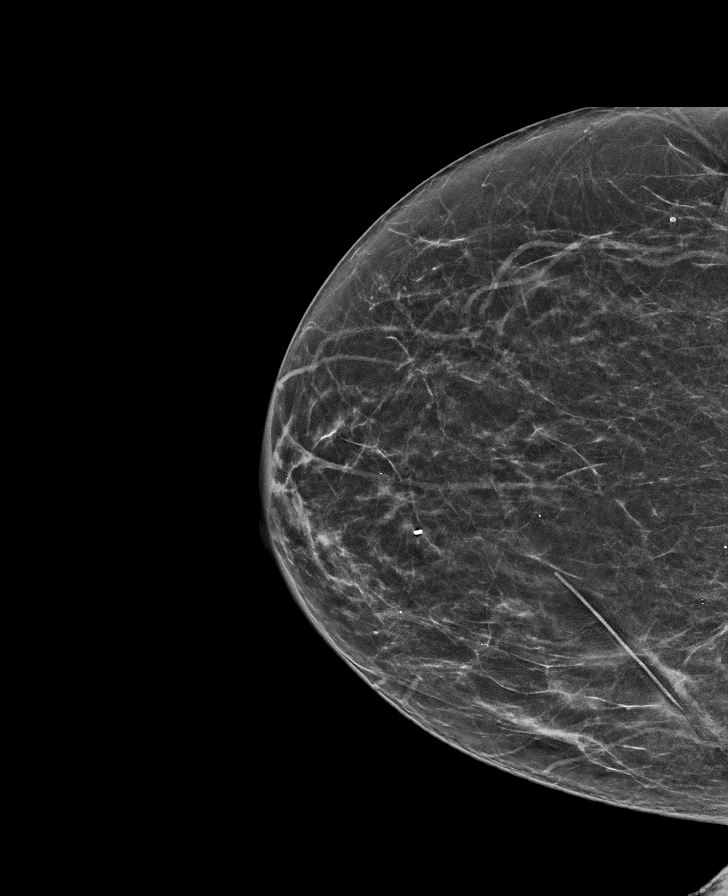

[L CC synth-2D]
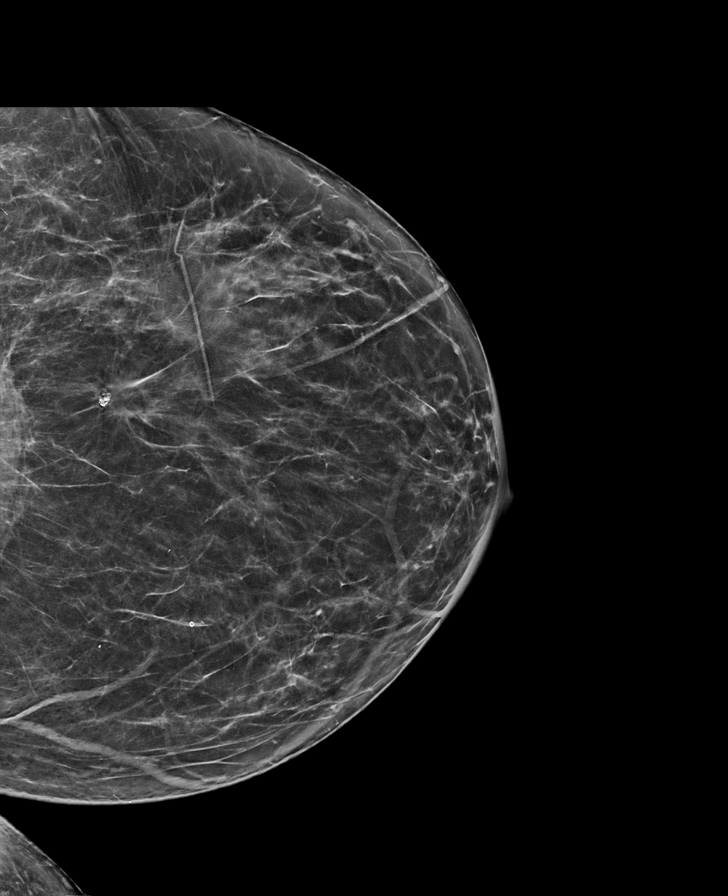

[L CC tomo · tomo slice 35/69.0]
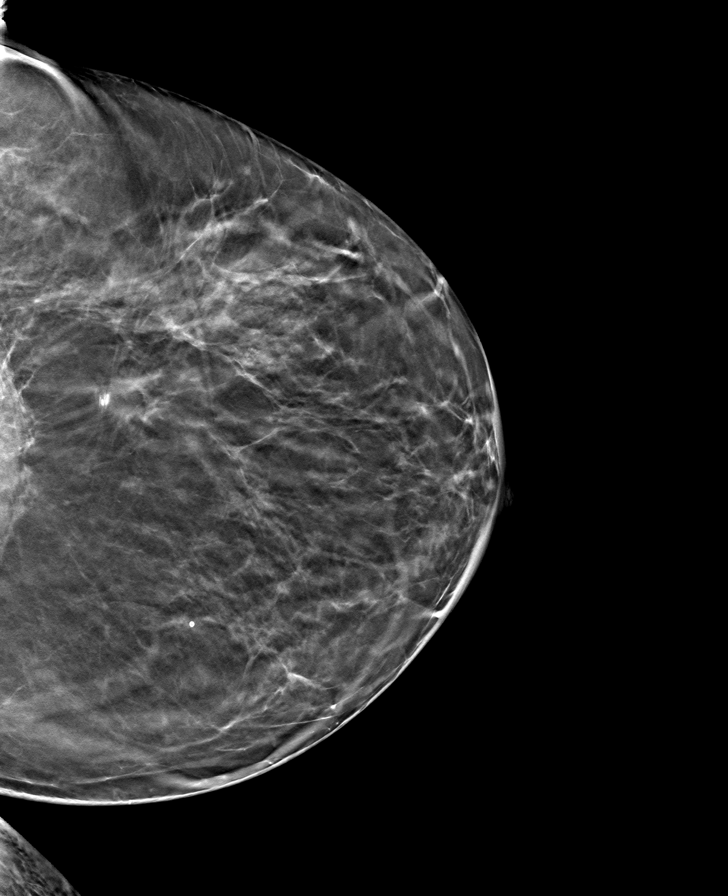

[L MLO tomo · tomo slice 41/82.0]
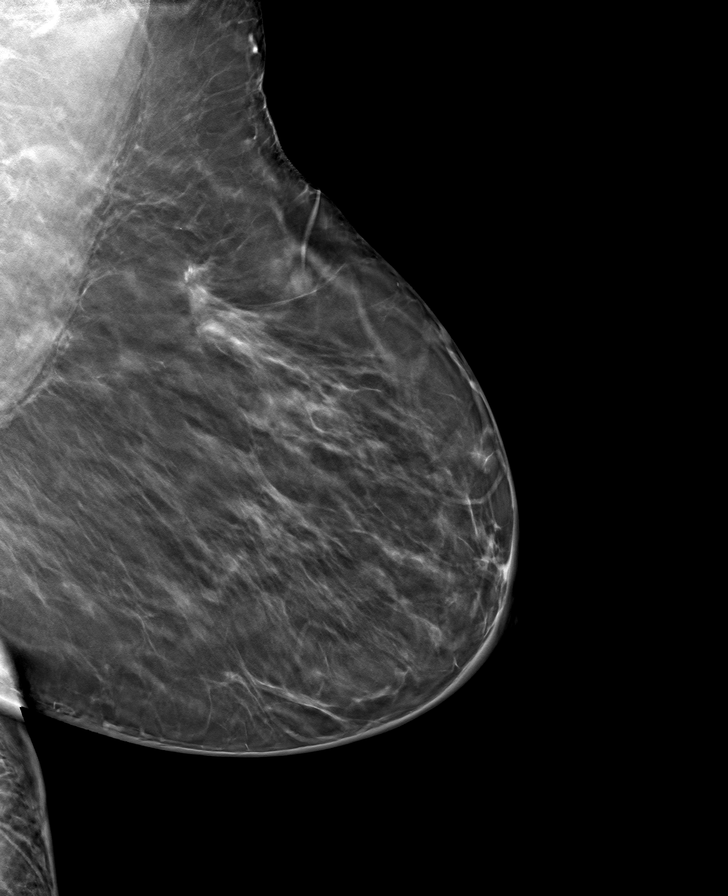

[R CC tomo · tomo slice 33/65.0]
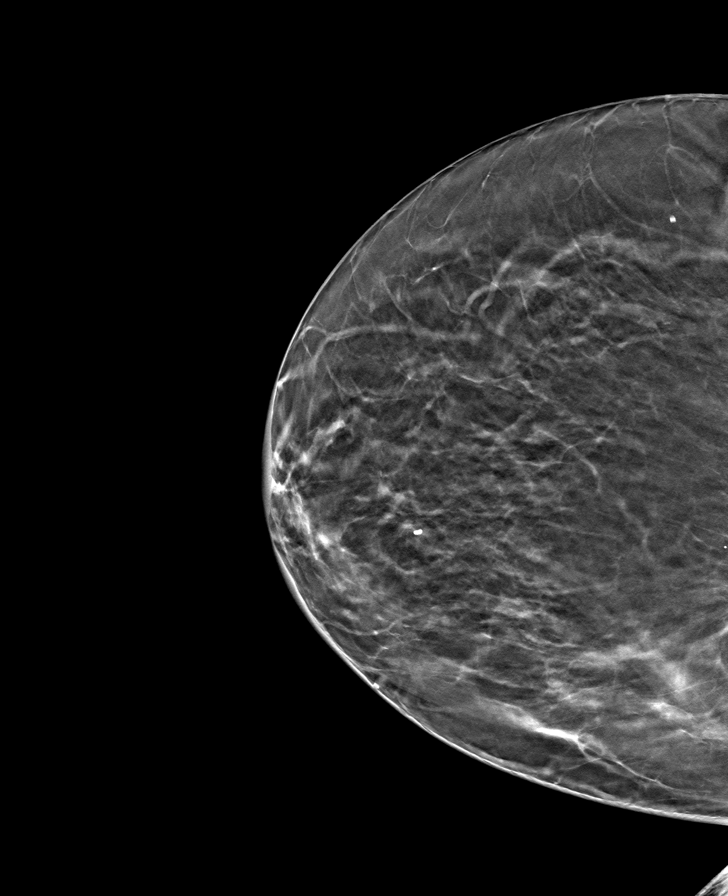

[R MLO tomo · tomo slice 39/76.0]
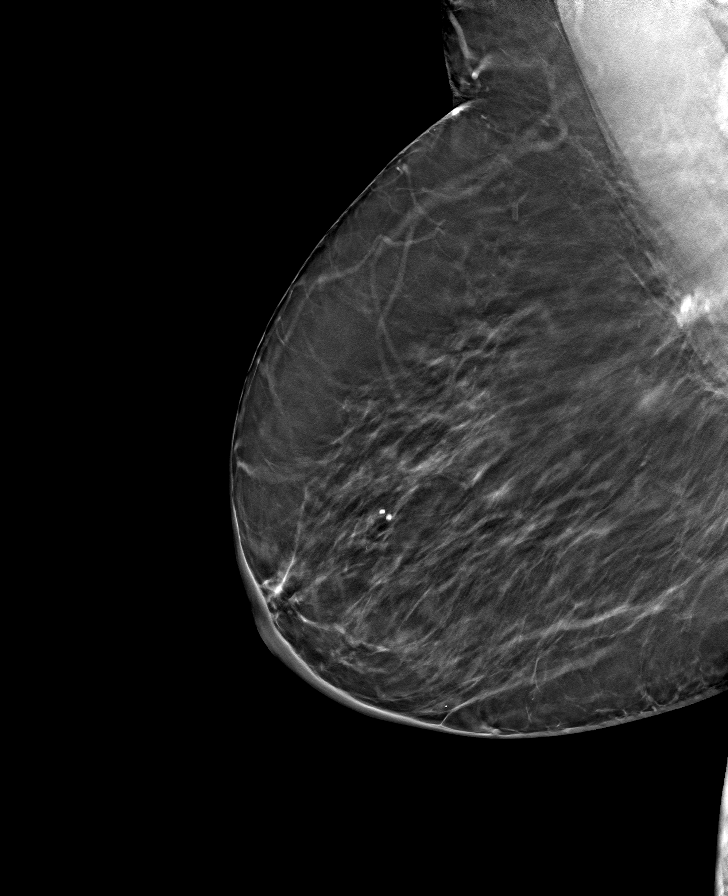

[8 of 24 positions shown; findings below may reference images not displayed]

ACR Breast Density Category b: There are scattered areas of
fibroglandular density.
FINDINGS: There are no findings suspicious for malignancy. There is density
and architectural distortion within the RIGHT breast, consistent
with postsurgical changes. These are stable in comparison to prior.
There is density and architectural distortion within the LEFT
breast, consistent with postsurgical changes. These are stable in
comparison to prior.
IMPRESSION: No mammographic evidence of malignancy. A result letter of this
screening mammogram will be mailed directly to the patient.

RECOMMENDATION:
Screening mammogram in one year. (Code:CR-5-ZO2)

BI-RADS CATEGORY  2: Benign.

## 2023-01-01 ENCOUNTER — Other Ambulatory Visit: Payer: Self-pay | Admitting: Internal Medicine

## 2023-01-01 DIAGNOSIS — Z1231 Encounter for screening mammogram for malignant neoplasm of breast: Secondary | ICD-10-CM

## 2023-02-07 ENCOUNTER — Ambulatory Visit: Payer: Medicare Other | Admitting: Hematology and Oncology

## 2023-02-14 ENCOUNTER — Ambulatory Visit
Admission: RE | Admit: 2023-02-14 | Discharge: 2023-02-14 | Disposition: A | Discharge status: Home / Self Care | Payer: Medicare Other | Source: Ambulatory Visit | Attending: Internal Medicine | Admitting: Internal Medicine

## 2023-02-14 DIAGNOSIS — Z1231 Encounter for screening mammogram for malignant neoplasm of breast: Secondary | ICD-10-CM

## 2023-03-06 ENCOUNTER — Inpatient Hospital Stay: Payer: Medicare Other | Attending: Hematology and Oncology | Admitting: Hematology and Oncology

## 2023-03-06 VITALS — BP 131/60 | HR 73 | Temp 97.2°F | Resp 18 | Wt 219.3 lb

## 2023-03-06 DIAGNOSIS — C50911 Malignant neoplasm of unspecified site of right female breast: Secondary | ICD-10-CM | POA: Diagnosis present

## 2023-03-06 DIAGNOSIS — Z9221 Personal history of antineoplastic chemotherapy: Secondary | ICD-10-CM | POA: Diagnosis not present

## 2023-03-06 DIAGNOSIS — Z923 Personal history of irradiation: Secondary | ICD-10-CM | POA: Diagnosis not present

## 2023-03-06 DIAGNOSIS — Z780 Asymptomatic menopausal state: Secondary | ICD-10-CM | POA: Diagnosis not present

## 2023-03-06 DIAGNOSIS — Z79899 Other long term (current) drug therapy: Secondary | ICD-10-CM | POA: Insufficient documentation

## 2023-03-06 DIAGNOSIS — Z170 Estrogen receptor positive status [ER+]: Secondary | ICD-10-CM | POA: Insufficient documentation

## 2023-03-06 DIAGNOSIS — Z79811 Long term (current) use of aromatase inhibitors: Secondary | ICD-10-CM | POA: Insufficient documentation

## 2023-03-06 DIAGNOSIS — C50912 Malignant neoplasm of unspecified site of left female breast: Secondary | ICD-10-CM | POA: Insufficient documentation

## 2023-03-06 DIAGNOSIS — Z7982 Long term (current) use of aspirin: Secondary | ICD-10-CM | POA: Insufficient documentation

## 2023-03-06 MED ORDER — LETROZOLE 2.5 MG PO TABS: 2.5000 mg | ORAL_TABLET | Freq: Every day | ORAL | 11 refills | Status: AC

## 2023-03-06 MED ORDER — SPIRONOLACTONE 25 MG PO TABS: 25.0000 mg | ORAL_TABLET | Freq: Every day | ORAL | Status: AC

## 2023-03-06 NOTE — Progress Notes (Signed)
Patient Care Team: Ralene Ok, MD as PCP - General (Internal Medicine)  DIAGNOSIS:  Encounter Diagnoses  Name Primary?   Bilateral malignant neoplasm of breast in female, estrogen receptor positive, unspecified site of breast ([facility]) Yes   Post-menopausal     SUMMARY OF ONCOLOGIC HISTORY: Oncology History  Bilateral breast cancer ([facility])  04/05/2016 Initial Diagnosis   [place]: Screen detected 1.5cm left breast mass and calcifications in the inner right breast. 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   06/01/2016 Surgery   Bilateral lumpectomies: right breast: invasive ductal carcinoma with DCIS, grade 2, 5 lymph nodes negative for carcinoma, and in the left breast: invasive mammary carcinoma, 1.5cm, 2 lymph nodes negative for carcinoma. Onctoype testing showed a score of 19 with a 12% chance of distant recurrence    06/2016 - 05/2017 Chemotherapy   TCH foll by Herceptin maintenance    12/2016 - 02/15/2017 Radiation Therapy   Bilateral Adj XRT   01/2017 -  Anti-estrogen oral therapy   Letrozole 2.5 mg daily     CHIEF COMPLIANT: Follow-up on letrozole  HISTORY OF PRESENT ILLNESS:  History of Present Illness   The patient, with a history of breast cancer, is currently on hormone therapy which was initiated in November 2018. She reports no side effects from the medication and is agreeable to continue the therapy for another year. She has not experienced any breast pain or discomfort. Her recent mammograms were satisfactory, showing non-dense breasts. The patient has not had a bone density test since 2020, which at the time was normal. She has been traveling frequently to [place] to visit her daughter and grandchildren.         ALLERGIES:  is allergic to penicillins.  MEDICATIONS:  Current Outpatient Medications   Medication Sig Dispense Refill   spironolactone (ALDACTONE) 25 MG tablet Take 1 tablet (25 mg total) by mouth daily.     amLODipine (NORVASC) 10 MG tablet Take 10 mg by mouth daily.     aspirin EC 81 MG tablet Take 1 tablet (81 mg total) by mouth daily.     Cholecalciferol (VITAMIN D3) 50 MCG (2000 UT) capsule Take 1 capsule (2,000 Units total) by mouth daily.     glipiZIDE (GLUCOTROL) 10 MG tablet Take 1 tablet (10 mg total) by mouth 2 (two) times daily before a meal.     hydrochlorothiazide (HYDRODIURIL) 25 MG tablet Take 1 tablet (25 mg total) by mouth daily.     letrozole (FEMARA) 2.5 MG tablet Take 1 tablet (2.5 mg total) by mouth daily. 30 tablet 11   losartan (COZAAR) 100 MG tablet Take 1 tablet (100 mg total) by mouth daily.     metoprolol succinate (TOPROL-XL) 100 MG 24 hr tablet Take 100 mg by mouth daily. Take with or immediately following a meal.     Multiple Vitamins-Minerals (CENTRUM SILVER 50+WOMEN) TABS Take 1 tablet by mouth daily.     vitamin B-12 (CYANOCOBALAMIN) 1000 MCG tablet Take 1 tablet (1,000 mcg total) by mouth daily.     No current facility-administered medications for this visit.    PHYSICAL EXAMINATION: ECOG PERFORMANCE STATUS: 1 - Symptomatic but completely ambulatory  Vitals:   03/06/23 1017  BP: 131/60  Pulse: 73  Resp: 18  Temp: (!) 97.2 F (36.2 C)  SpO2: 99%   Filed Weights  03/06/23 1017  Weight: 219 lb 4.8 oz (99.5 kg)    Physical Exam          (exam performed in the presence of a chaperone)  LABORATORY DATA:  I have reviewed the data as listed     No data to display          No results found for: "WBC", "HGB", "HCT", "MCV", "PLT", "NEUTROABS"  ASSESSMENT & PLAN:  Bilateral breast cancer ([facility]) 06/01/2016:  Right breast: invasive ductal carcinoma with DCIS, grade 2, 5 lymph nodes negative for carcinoma,ER+ 91%, PR+ 71%, HER-2 negative    Left breast: invasive mammary carcinoma, 1.5cm, 2 lymph nodes negative for carcinoma ER+  14%, PR+ 30%, HER-2 positive by FISH. Onctoype testing showed a score of 19 with a 12% chance of distant recurrence  (Care given in [facility] in [facility])   S/P adj chemo with TCH-Herceptin maintenance Adj XRT   Current Treatment: Letrozole 2.5 mg Daily started 01/2017 Letrozole Toxicities: None We will plan to treat her for 7 years   Surveillance: 1. Breast Exam: 03/06/2023: Benign 2. Mammograms: 02/16/2023: Benign breast density category B   Patients wants to get refills 30 days at a time because of insurance reasons Patient loves to go to [employer] (her daughter works there)    Bone Health Last bone density test done in 2020 in [place] with normal results. -Schedule a bone density test at the [facility] on [address] within the next month.  Colon Health Last colonoscopy done in 2018 with one polyp found. Family history of colon cancer. -Recommend scheduling a colonoscopy with primary care provider.  Follow-up in one year for final consultation, then transition to annual checks with nurse practitioner.          Orders Placed This Encounter  Procedures   DG Bone Density    Standing Status:   Future    Standing Expiration Date:   03/05/2024    Order Specific Question:   Reason for Exam (SYMPTOM  OR DIAGNOSIS REQUIRED)    Answer:   post menopausal    Order Specific Question:   Preferred imaging location?    Answer:   [facility]    Order Specific Question:   Release to patient    Answer:   Immediate   The patient has a good understanding of the overall plan. she agrees with it. she will call with any problems that may develop before the next visit here. Total time spent: 30 mins including face to face time and time spent for planning, charting and co-ordination of care   Tamsen Meek, MD 03/06/23

## 2023-03-06 NOTE — Assessment & Plan Note (Signed)
06/01/2016:  Right breast: invasive ductal carcinoma with DCIS, grade 2, 5 lymph nodes negative for carcinoma,ER+ 91%, PR+ 71%, HER-2 negative    Left breast: invasive mammary carcinoma, 1.5cm, 2 lymph nodes negative for carcinoma ER+ 14%, PR+ 30%, HER-2 positive by FISH. Onctoype testing showed a score of 19 with a 12% chance of distant recurrence  (Care given in [facility] in [facility])   S/P adj chemo with TCH-Herceptin maintenance Adj XRT   Current Treatment: Letrozole 2.5 mg Daily started 01/2017 Letrozole Toxicities: None We will plan to treat her for 7 years   Surveillance: 1. Breast Exam: 03/06/2023: Benign 2. Mammograms: 02/16/2023: Benign breast density category B   Patients wants to get refills 30 days at a time because of insurance reasons Patient loves to go to [employer] (her daughter works there)  RTC in 1 year

## 2023-04-03 ENCOUNTER — Ambulatory Visit (HOSPITAL_BASED_OUTPATIENT_CLINIC_OR_DEPARTMENT_OTHER)
Admission: RE | Admit: 2023-04-03 | Discharge: 2023-04-03 | Disposition: A | Discharge status: Home / Self Care | Payer: Medicare Other | Source: Ambulatory Visit | Attending: Hematology and Oncology | Admitting: Hematology and Oncology

## 2023-04-03 DIAGNOSIS — Z780 Asymptomatic menopausal state: Secondary | ICD-10-CM | POA: Diagnosis present

## 2023-06-27 ENCOUNTER — Encounter: Payer: Self-pay | Admitting: Internal Medicine

## 2023-12-24 ENCOUNTER — Telehealth: Payer: Self-pay

## 2023-12-24 NOTE — Telephone Encounter (Signed)
 S/w patient regarding letrozole  management in light of recent surgery scheduled for 12/26/23.  Per Dr. Gudena, patient is to hold letrozole  2.5 mg for 3 days prior to surgery and for 3 days after surgery.  Patient verbalized an understanding of the information.  Information faxed at patient's request to Dr. Ozell Amor office at [facility].

## 2024-03-06 ENCOUNTER — Inpatient Hospital Stay: Payer: Medicare Other | Admitting: Hematology and Oncology

## 2024-03-06 NOTE — Assessment & Plan Note (Deleted)
° °  06/01/2016:  Right breast: invasive ductal carcinoma with DCIS, grade 2, 5 lymph nodes negative for carcinoma,ER+ 91%, PR+ 71%, HER-2 negative    Left breast: invasive mammary carcinoma, 1.5cm, 2 lymph nodes negative for carcinoma ER+ 14%, PR+ 30%, HER-2 positive by FISH. Onctoype testing showed a score of 19 with a 12% chance of distant recurrence  (Care given in [facility] in [facility])   S/P adj chemo with TCH-Herceptin maintenance Adj XRT   Current Treatment: Letrozole  2.5 mg Daily started 01/2017 Letrozole  Toxicities: None We will plan to treat her for 7 years   Surveillance: 1. Breast Exam: 03/06/2024: Benign 2. Mammograms: 02/16/2023: Benign breast density category B 3.  Bone density 04/03/2023: T-score -1.2: Mild osteopenia   Patients wants to get refills 30 days at a time because of insurance reasons Patient loves to go to [employer] (her daughter works there)
